# Patient Record
Sex: Female | Born: 1960 | Race: White | Hispanic: No | Marital: Married | Smoking: Never smoker
Health system: Southern US, Community
[De-identification: ages and names within clinical notes are randomized; demographics above are authoritative.]

## PROBLEM LIST (undated history)

## (undated) DIAGNOSIS — Z87898 Personal history of other specified conditions: Secondary | ICD-10-CM

## (undated) DIAGNOSIS — G8929 Other chronic pain: Secondary | ICD-10-CM

## (undated) DIAGNOSIS — R51 Headache: Secondary | ICD-10-CM

## (undated) DIAGNOSIS — F419 Anxiety disorder, unspecified: Secondary | ICD-10-CM

## (undated) HISTORY — PX: BACK SURGERY: SHX140

## (undated) HISTORY — PX: OTHER SURGICAL HISTORY: SHX169

---

## 2009-07-21 ENCOUNTER — Ambulatory Visit: Payer: Self-pay | Admitting: Family Medicine

## 2009-07-21 DIAGNOSIS — M79609 Pain in unspecified limb: Secondary | ICD-10-CM

## 2011-05-07 IMAGING — CR DG FOOT COMPLETE 3+V*L*
3 series · 3 of 3 positions shown · non-contrast
Comparison: None

CLINICAL DATA: Foot pain.  No injury.

LEFT FOOT - COMPLETE 3+ VIEW

[view not recorded (1 of 3)]
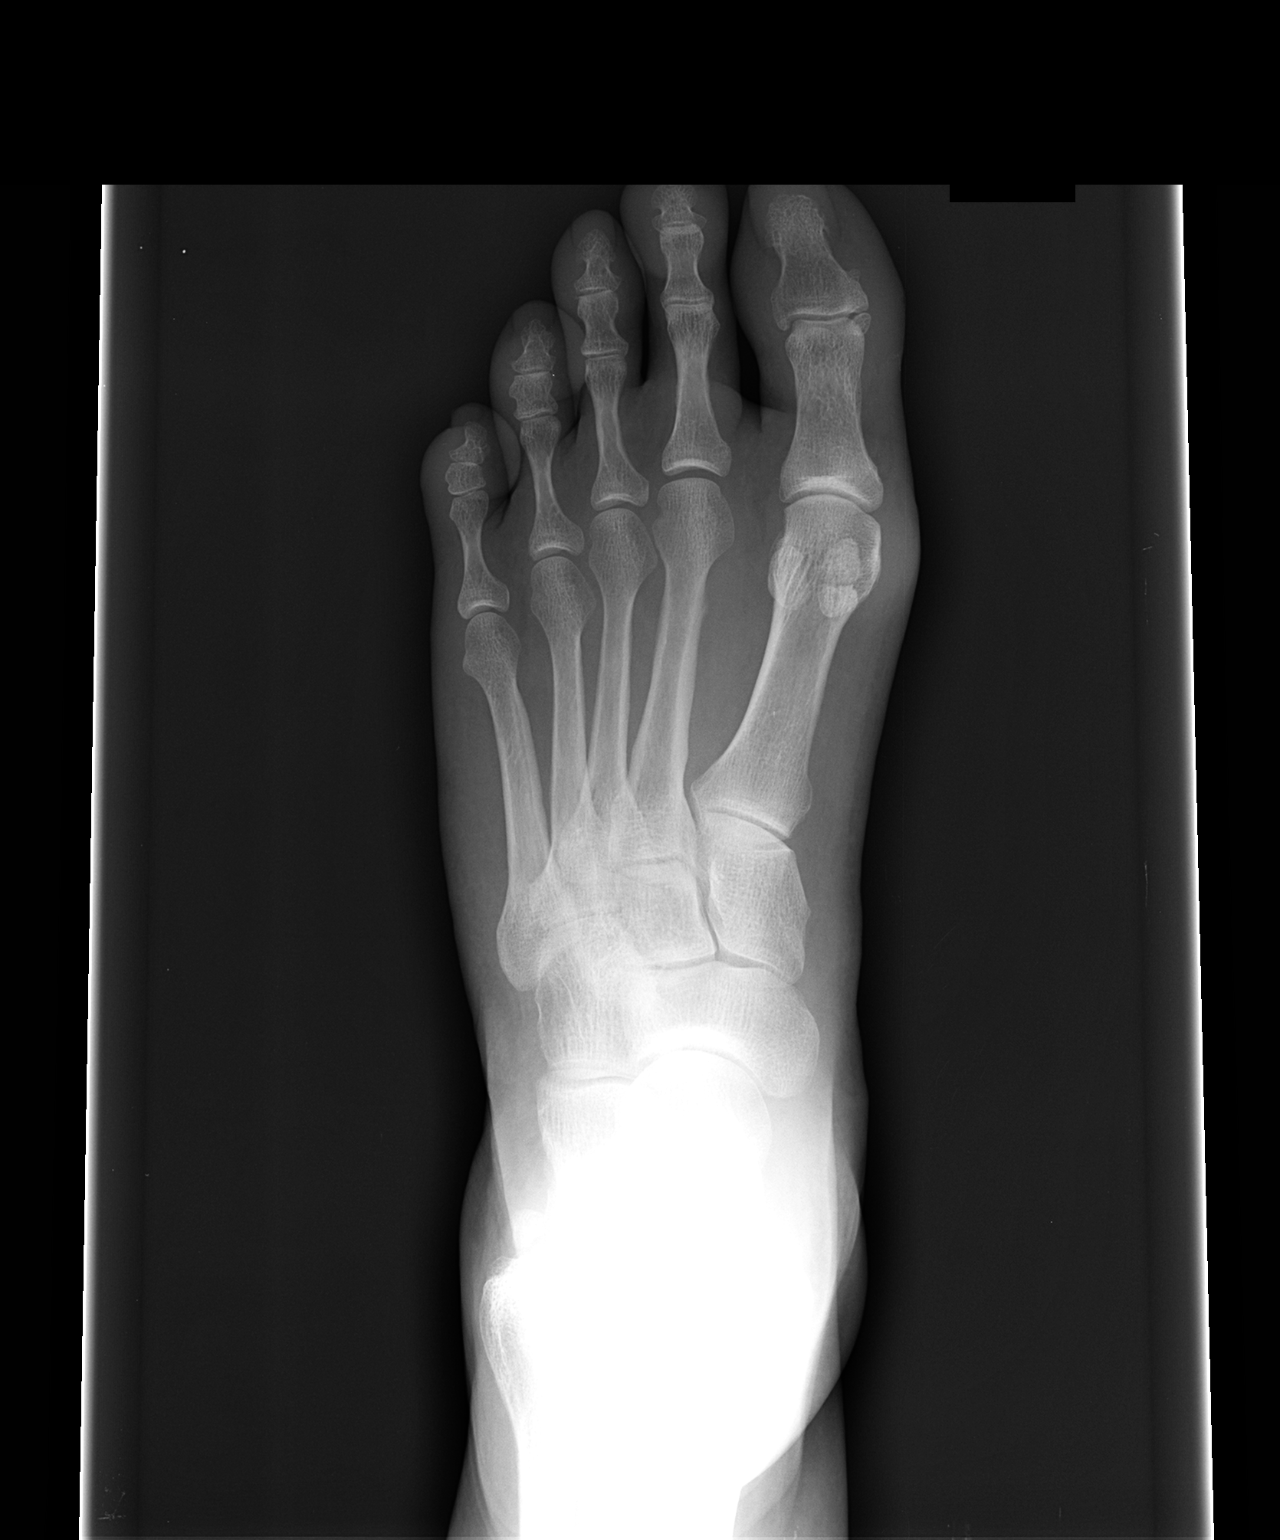

[view not recorded (2 of 3)]
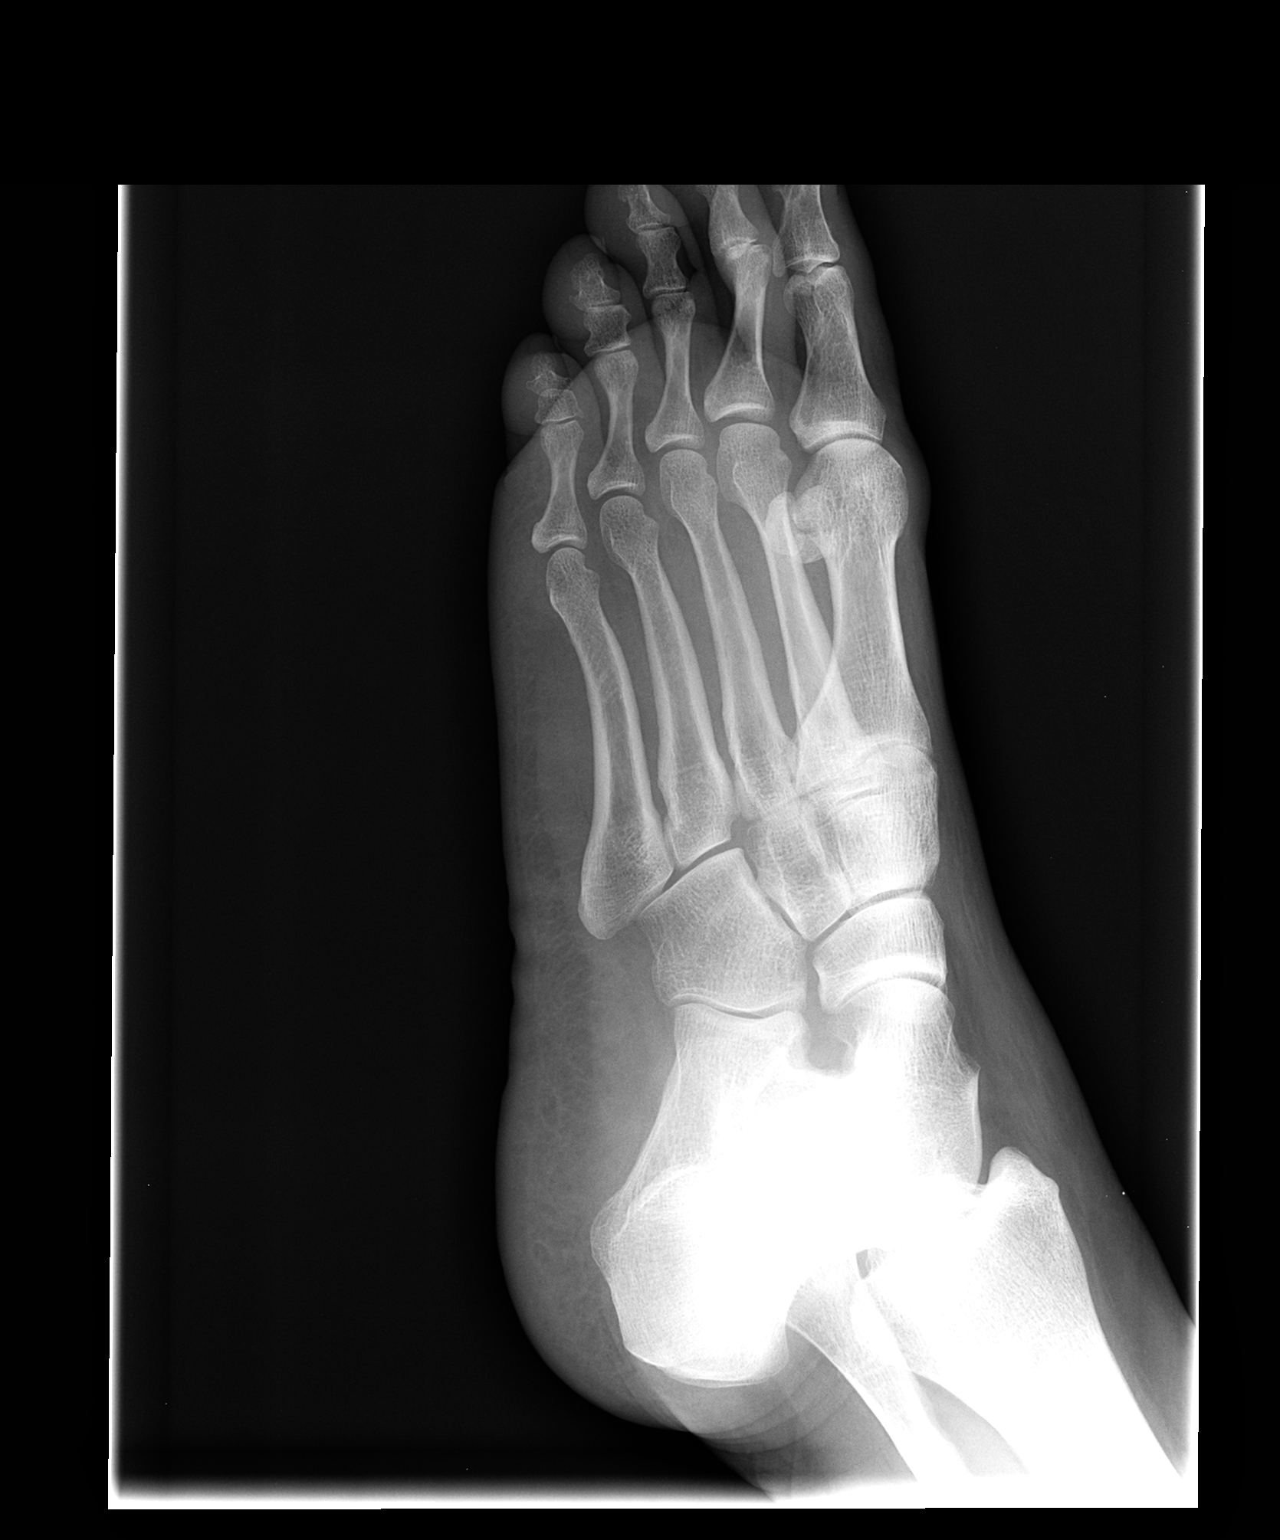

[view not recorded (3 of 3)]
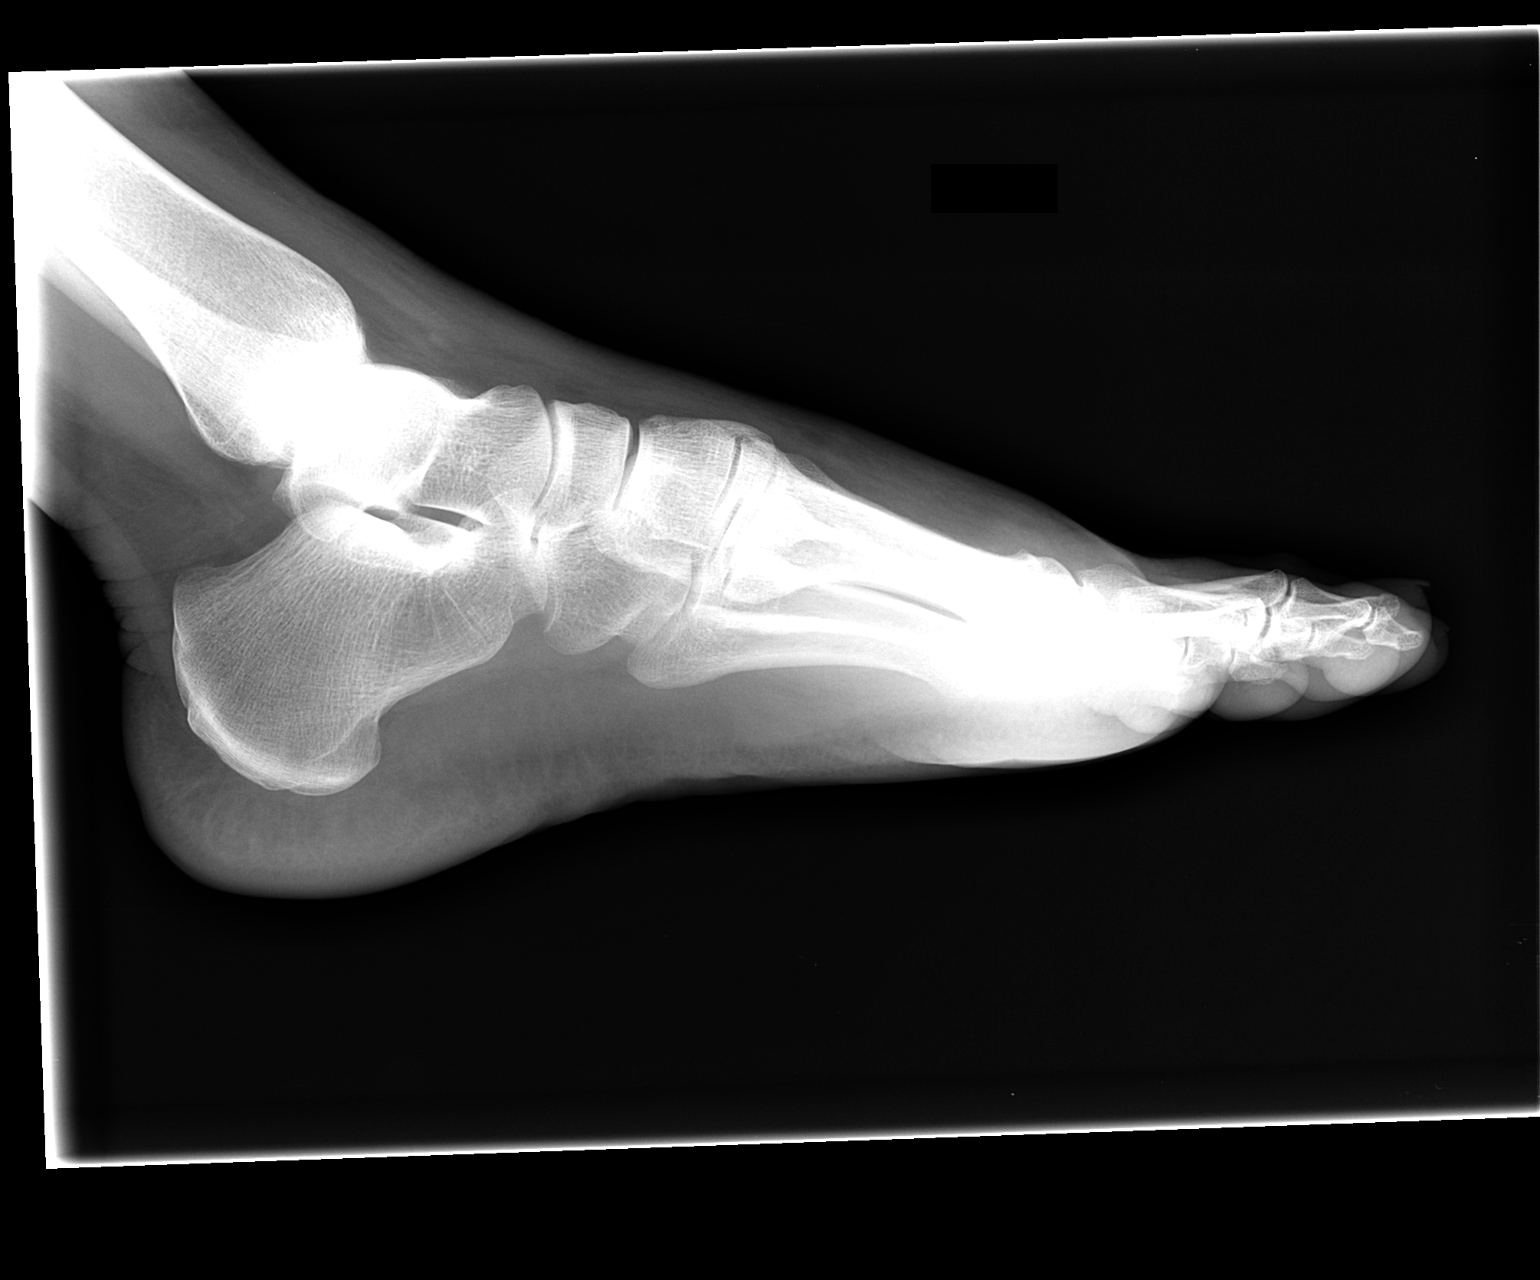

[3 of 3 positions shown; findings below may reference images not displayed]

FINDINGS: Negative for fracture.  There is no arthropathy or
erosion.  No significant degenerative change.
IMPRESSION: Negative

## 2015-01-02 ENCOUNTER — Emergency Department
Admission: EM | Admit: 2015-01-02 | Discharge: 2015-01-02 | Disposition: A | Discharge status: Home / Self Care | Payer: 59 | Source: Home / Self Care | Attending: Emergency Medicine | Admitting: Emergency Medicine

## 2015-01-02 ENCOUNTER — Encounter: Payer: Self-pay | Admitting: Emergency Medicine

## 2015-01-02 DIAGNOSIS — J0120 Acute ethmoidal sinusitis, unspecified: Secondary | ICD-10-CM | POA: Diagnosis not present

## 2015-01-02 DIAGNOSIS — J322 Chronic ethmoidal sinusitis: Secondary | ICD-10-CM

## 2015-01-02 HISTORY — DX: Personal history of other specified conditions: Z87.898

## 2015-01-02 HISTORY — DX: Anxiety disorder, unspecified: F41.9

## 2015-01-02 HISTORY — DX: Other chronic pain: G89.29

## 2015-01-02 HISTORY — DX: Headache: R51

## 2015-01-02 LAB — POCT INFLUENZA A/B
INFLUENZA A, POC: NEGATIVE
INFLUENZA B, POC: NEGATIVE

## 2015-01-02 MED ORDER — AMOXICILLIN 500 MG PO CAPS: 500.0000 mg | ORAL_CAPSULE | Freq: Three times a day (TID) | ORAL | Status: AC

## 2015-01-02 NOTE — ED Provider Notes (Signed)
CSN: [id number]     Arrival date & time 01/02/15  1234 History   First MD Initiated Contact with Patient 01/02/15 1409     Chief Complaint  Patient presents with  . Nasal Congestion  . Cough   (Consider location/radiation/quality/duration/timing/severity/associated sxs/prior Treatment) Patient is a 54 y.o. female presenting with cough. The history is provided by the patient. No language interpreter was used.  Cough Cough characteristics:  Productive Sputum characteristics:  Nondescript Severity:  Moderate Onset quality:  Gradual Duration:  4 days Timing:  Constant Chronicity:  New Smoker: no   Context: upper respiratory infection   Relieved by:  Nothing Worsened by:  Nothing tried Ineffective treatments:  None tried Associated symptoms: sinus congestion and sore throat   Associated symptoms: no chest pain     Past Medical History  Diagnosis Date  . Chronic headaches   . Anxiety   . Hx of motion sickness    Past Surgical History  Procedure Laterality Date  . Back surgery    . Tubular     History reviewed. No pertinent family history. History  Substance Use Topics  . Smoking status: Never Smoker   . Smokeless tobacco: Not on file  . Alcohol Use: No   OB History    No data available     Review of Systems  HENT: Positive for sore throat.   Respiratory: Positive for cough.   Cardiovascular: Negative for chest pain.  All other systems reviewed and are negative.   Allergies  Review of patient's allergies indicates no known allergies.  Home Medications   Prior to Admission medications   Medication Sig Start Date End Date Taking? Authorizing Provider  ALPRAZolam Prudy Feeler(XANAX) 0.25 MG tablet Take 0.25 mg by mouth at bedtime as needed for anxiety.   Yes Historical Provider, MD  butalbital-acetaminophen-caffeine (FIORICET WITH CODEINE) 50-325-40-30 MG per capsule Take 1 capsule by mouth every 4 (four) hours as needed for headache.   Yes Historical Provider, MD   ondansetron (ZOFRAN-ODT) 4 MG disintegrating tablet Take 4 mg by mouth every 8 (eight) hours as needed for nausea or vomiting (motion sickness).   Yes Historical Provider, MD   BP 117/73 mmHg  Pulse 117  Temp(Src) 98.5 F (36.9 C)  Resp 18  Ht 5\' 9"  (1.753 m)  Wt 160 lb (72.576 kg)  BMI 23.62 kg/m2  SpO2 99%  LMP 12/08/2014 Physical Exam  Constitutional: She is oriented to person, place, and time. She appears well-developed and well-nourished.  HENT:  Head: Normocephalic and atraumatic.  Right Ear: External ear normal.  Left Ear: External ear normal.  Nose: Nose normal.  Mouth/Throat: Oropharynx is clear and moist.  Eyes: Conjunctivae and EOM are normal. Pupils are equal, round, and reactive to light.  Neck: Normal range of motion.  Cardiovascular: Normal rate and normal heart sounds.   Pulmonary/Chest: Effort normal.  Abdominal: Soft. She exhibits no distension.  Musculoskeletal: Normal range of motion.  Neurological: She is alert and oriented to person, place, and time.  Skin: Skin is warm.  Psychiatric: She has a normal mood and affect.  Nursing note and vitals reviewed.   ED Course  Procedures (including critical care time) Labs Review Labs Reviewed - No data to display  Imaging Review No results found.   MDM   1. Ethmoid sinusitis, unspecified chronicity    amoxicillian AVS See your Md for recheck if symptoms persist    Elson AreasLeslie K Sofia, [place] JerseyPA-C 01/02/15 1450

## 2015-01-02 NOTE — ED Notes (Signed)
Reports congestion with cough and ear pain bilaterally x 4 days. No OTC this a.m.  No documented fever.

## 2015-01-02 NOTE — Discharge Instructions (Signed)
